# Patient Record
Sex: Male | Born: 1995 | Race: White | Hispanic: No | Marital: Single | Smoking: Current some day smoker
Health system: Southern US, Community
[De-identification: ages and names within clinical notes are randomized; demographics above are authoritative.]

## PROBLEM LIST (undated history)

## (undated) HISTORY — PX: ELBOW SURGERY: SHX618

---

## 2017-02-02 ENCOUNTER — Ambulatory Visit
Admission: RE | Admit: 2017-02-02 | Discharge: 2017-02-02 | Disposition: A | Discharge status: Home / Self Care | Payer: BC Managed Care – PPO | Source: Ambulatory Visit | Attending: Family Medicine | Admitting: Family Medicine

## 2017-02-02 ENCOUNTER — Other Ambulatory Visit: Payer: Self-pay | Admitting: Family Medicine

## 2017-02-02 DIAGNOSIS — M7989 Other specified soft tissue disorders: Secondary | ICD-10-CM | POA: Diagnosis not present

## 2017-02-02 DIAGNOSIS — S8991XA Unspecified injury of right lower leg, initial encounter: Secondary | ICD-10-CM

## 2017-02-02 DIAGNOSIS — X58XXXA Exposure to other specified factors, initial encounter: Secondary | ICD-10-CM | POA: Diagnosis not present

## 2017-02-02 DIAGNOSIS — M25561 Pain in right knee: Secondary | ICD-10-CM | POA: Diagnosis not present

## 2017-02-04 ENCOUNTER — Other Ambulatory Visit: Payer: Self-pay | Admitting: Family Medicine

## 2017-02-04 DIAGNOSIS — M25561 Pain in right knee: Secondary | ICD-10-CM

## 2017-02-05 ENCOUNTER — Ambulatory Visit
Admission: RE | Admit: 2017-02-05 | Discharge: 2017-02-05 | Disposition: A | Discharge status: Home / Self Care | Payer: BC Managed Care – PPO | Source: Ambulatory Visit | Attending: Family Medicine | Admitting: Family Medicine

## 2017-02-05 DIAGNOSIS — S8391XA Sprain of unspecified site of right knee, initial encounter: Secondary | ICD-10-CM | POA: Diagnosis not present

## 2017-02-05 DIAGNOSIS — M25561 Pain in right knee: Secondary | ICD-10-CM | POA: Diagnosis present

## 2017-02-05 DIAGNOSIS — X58XXXA Exposure to other specified factors, initial encounter: Secondary | ICD-10-CM | POA: Diagnosis not present

## 2017-02-05 DIAGNOSIS — S8002XA Contusion of left knee, initial encounter: Secondary | ICD-10-CM | POA: Insufficient documentation

## 2017-04-03 ENCOUNTER — Ambulatory Visit
Admission: RE | Admit: 2017-04-03 | Discharge: 2017-04-03 | Disposition: A | Discharge status: Home / Self Care | Payer: BC Managed Care – PPO | Source: Ambulatory Visit | Attending: Family Medicine | Admitting: Family Medicine

## 2017-04-03 ENCOUNTER — Other Ambulatory Visit: Payer: Self-pay | Admitting: Family Medicine

## 2017-04-03 DIAGNOSIS — S8992XA Unspecified injury of left lower leg, initial encounter: Secondary | ICD-10-CM

## 2017-04-03 DIAGNOSIS — X58XXXA Exposure to other specified factors, initial encounter: Secondary | ICD-10-CM | POA: Insufficient documentation

## 2017-04-03 DIAGNOSIS — S7012XA Contusion of left thigh, initial encounter: Secondary | ICD-10-CM | POA: Insufficient documentation

## 2017-04-03 DIAGNOSIS — S8392XA Sprain of unspecified site of left knee, initial encounter: Secondary | ICD-10-CM | POA: Diagnosis present

## 2017-04-03 DIAGNOSIS — M25462 Effusion, left knee: Secondary | ICD-10-CM | POA: Insufficient documentation

## 2017-04-03 DIAGNOSIS — S83512A Sprain of anterior cruciate ligament of left knee, initial encounter: Secondary | ICD-10-CM | POA: Insufficient documentation

## 2017-04-03 HISTORY — PX: KNEE ARTHROSCOPY W/ ACL RECONSTRUCTION: SHX1858

## 2017-11-25 ENCOUNTER — Ambulatory Visit (INDEPENDENT_AMBULATORY_CARE_PROVIDER_SITE_OTHER): Payer: BC Managed Care – PPO | Admitting: Family Medicine

## 2017-11-25 DIAGNOSIS — R4589 Other symptoms and signs involving emotional state: Secondary | ICD-10-CM

## 2017-11-25 NOTE — Progress Notes (Signed)
Patient presented to review returner preparticipation form. Refer to form for details of visit. Form placed in patient's chart. Discussed case with Counseling Center. Counseling Center has agreed to see patient. Patient is to call and make appointment. Informed trainer.

## 2018-01-22 ENCOUNTER — Other Ambulatory Visit: Payer: Self-pay | Admitting: Family Medicine

## 2018-01-22 ENCOUNTER — Ambulatory Visit (INDEPENDENT_AMBULATORY_CARE_PROVIDER_SITE_OTHER): Payer: BC Managed Care – PPO | Admitting: Family Medicine

## 2018-01-22 ENCOUNTER — Ambulatory Visit
Admission: RE | Admit: 2018-01-22 | Discharge: 2018-01-22 | Disposition: A | Discharge status: Home / Self Care | Payer: BC Managed Care – PPO | Source: Ambulatory Visit | Attending: Family Medicine | Admitting: Family Medicine

## 2018-01-22 ENCOUNTER — Encounter: Payer: Self-pay | Admitting: Family Medicine

## 2018-01-22 DIAGNOSIS — M25461 Effusion, right knee: Secondary | ICD-10-CM

## 2018-01-23 ENCOUNTER — Encounter (HOSPITAL_COMMUNITY): Payer: Self-pay

## 2018-01-23 ENCOUNTER — Ambulatory Visit (HOSPITAL_COMMUNITY)
Admission: RE | Admit: 2018-01-23 | Discharge: 2018-01-23 | Disposition: A | Discharge status: Home / Self Care | Payer: BC Managed Care – PPO | Source: Ambulatory Visit | Attending: Family Medicine | Admitting: Family Medicine

## 2018-01-23 DIAGNOSIS — X58XXXA Exposure to other specified factors, initial encounter: Secondary | ICD-10-CM | POA: Diagnosis not present

## 2018-01-23 DIAGNOSIS — M25461 Effusion, right knee: Secondary | ICD-10-CM | POA: Diagnosis present

## 2018-01-23 DIAGNOSIS — S83521A Sprain of posterior cruciate ligament of right knee, initial encounter: Secondary | ICD-10-CM | POA: Diagnosis not present

## 2018-02-04 NOTE — Progress Notes (Signed)
Patient presents today with history of right knee pain and swelling. He states that during practice on 8/20 his knee went into the ground forcefully. He denies feeling a "pop." He has been doing [employer] daily. He hasn't needed to take any medication for pain consistently. Patient has recently returned back to football from ACL reconstruction on his other knee.   ROS: Negative except mentioned above. Vitals as per Epic.  GENERAL: NAD, good affect MSK: R Knee - minimal effusion, FROM, -McMurray, -Lachman, -Anterior Drawer, +Posterior Drawer, no significant varus or valgus instability, nv intact NEURO: CN II-XII grossly intact   A/P: R Knee Injury - suspect PCL injury, discussed with patient that typically not surgically repaired, will get imaging and have patient see Dr. Ardine Eng in the coming days, will discuss brace with trainer, NSAIDs prn pain, continue GameReady.   *Patient started to see counseling services on campus before this injury due to his anxiety related to coming back to football from his ACL. I have encouraged him to continue seeing the counselor especially given this recent injury/setback. Patient addresses understanding and says he will do so. He obviously admits to his disappointment in getting injured again but denies any suicidal or homicidal ideations at this time. Encouraged patient to seek help if needed. Reviewed what to do in case of a crisis. Informed patient that if he wants he can allow counselor to speak to me as well regarding their sessions. Patient is always welcome to come see me to talk. Patient appreciative.  Reviewed everything above with trainer.

## 2018-02-13 ENCOUNTER — Ambulatory Visit (INDEPENDENT_AMBULATORY_CARE_PROVIDER_SITE_OTHER): Payer: BC Managed Care – PPO | Admitting: Family Medicine

## 2018-02-13 DIAGNOSIS — F3289 Other specified depressive episodes: Secondary | ICD-10-CM

## 2018-02-26 ENCOUNTER — Ambulatory Visit (INDEPENDENT_AMBULATORY_CARE_PROVIDER_SITE_OTHER): Payer: BC Managed Care – PPO | Admitting: Family Medicine

## 2018-02-26 DIAGNOSIS — F329 Major depressive disorder, single episode, unspecified: Secondary | ICD-10-CM

## 2018-02-26 DIAGNOSIS — R4589 Other symptoms and signs involving emotional state: Secondary | ICD-10-CM

## 2018-02-26 NOTE — Progress Notes (Signed)
She was asked to come in to discuss recent appointment with Dr. Maryruth Bun. Patient states that the appointment went well.  He has started his 50 mg of Zoloft.  He denies any side effects at this time. He denies any suicidal or homicidal ideations. He admits that he went home to speak to his parents about a possible Medical DQ from football.  This is given his multiple knee injuries (MCL, ACL, PCL) over the last few years and also his current depression. He states that his parents were supportive of whatever decision makes him happy.  Patient denies any problems with sleep or appetite.  ROS: Negative except mentioned above. Vitals as per Epic GENERAL: NAD, normal affect, good eye contact  NEURO: CN II-XII grossly intact   A/P: Depression - has started Zoloft, has follow-up appointment with Dr. Maryruth Bun in 1 week, would continue to see counseling services as he has been doing [place] [employer]), he knows the crisis line if he needs it, denies any suicidal ideations at this time, will follow up with me as needed, will discuss Medical DQ with Dr. Ardine Eng and athletic training staff.

## 2018-03-13 ENCOUNTER — Ambulatory Visit (INDEPENDENT_AMBULATORY_CARE_PROVIDER_SITE_OTHER): Payer: BC Managed Care – PPO | Admitting: Family Medicine

## 2018-03-13 DIAGNOSIS — F329 Major depressive disorder, single episode, unspecified: Secondary | ICD-10-CM

## 2018-03-13 DIAGNOSIS — R4589 Other symptoms and signs involving emotional state: Secondary | ICD-10-CM

## 2018-03-13 NOTE — Progress Notes (Signed)
Asked patient to come in to discuss recent conversation with Athletic Administrators regarding possible Medical DQ.  Told patient that Athletics has been presented with the information and that they are considering different options for [place].  I wanted to make sure that Lance Brown knows that he has my support and Dr. Shelda Altes support.  I have recommended that he continue to meet with counseling services and Dr.Kapur as scheduled.  He knows about the crisis line if needed.  He admits that he is taking his antidepressant.  He denies any suicidal or homicidal ideations at this time.  He plans on going to [place] with his girlfriend next week fall break.  I am hoping that a decision will be made by that time.  Patient appreciative.  Encouraged patient to reach out to me if he has any concerns or questions.

## 2018-04-10 ENCOUNTER — Ambulatory Visit (INDEPENDENT_AMBULATORY_CARE_PROVIDER_SITE_OTHER): Payer: BC Managed Care – PPO | Admitting: Family Medicine

## 2018-04-10 ENCOUNTER — Encounter: Payer: Self-pay | Admitting: Family Medicine

## 2018-04-10 VITALS — Resp 14

## 2018-04-10 DIAGNOSIS — F329 Major depressive disorder, single episode, unspecified: Secondary | ICD-10-CM

## 2018-04-10 DIAGNOSIS — F32A Depression, unspecified: Secondary | ICD-10-CM

## 2018-04-10 NOTE — Progress Notes (Signed)
Patient presents today for follow-up regarding his depression.  Patient states that he has been doing well since leaving football.  He has been concentrating mostly on his schoolwork.  He has been doing some athletic activity in the gym as well.  He continues to meet with the counselor and the counseling center on a regular basis and also has been meeting with Dr. Maryruth Bun his psychiatrist.  He admits that he is continuing to take his antidepressant as prescribed.  He plans to get involved in some group activities on campus next semester.  He has plans to go home with his girlfriend for Thanksgiving.  He admits that he has continued to socialize with his friends on the football team.  They have not reacted negatively to his decision on leaving football.  He has been happy about this. He denies any suicidal or homicidal thoughts.  ROS: Negative except mentioned above. Vitals as per Epic. GENERAL: NAD, normal affect, good eye contact NEURO: CN II-XII grossly intact   A/P: Depression -appears to be stable at this time, continue to meet with Dr. Maryruth Bun and counselor as suggested by them, follow-up with me in December or early January, sooner if needed.  S/P PCL injury - denies any issues at this time, needs to continue rehab, will send out email to Adria Dill, Dr. Ardine Eng and Excell Seltzer to coordinate a time for them to meet at the [facility] before or after a basketball game, would recommend that he continue his rehab at the [facility] under the supervision of Minerva Areola and strength and conditioning.

## 2018-04-21 NOTE — Progress Notes (Signed)
Patient presents today to discuss his depressed mood.  He has been seeing a counselor at the counseling center.  Patient admits that he has lost interest in football.  He states that initially he had anxiety related to returning to football after his MCL and ACL injuries.  After his recent setback with his PCL injury he has continued to disconnect with football.  He states that football does not bring him any enjoyment anymore.  He would mostly play just for his family.  He has a small group of friends on the team.  He feels that the coaching staff does not pay attention to him since he is injured.  He admits that he has had thoughts of suicide recently but does not have a plan.  He denies being suicidal today.  He denies any homicidal ideations.  He admits to normal sleep and appetite.  He does have a girlfriend who he confides in.  He believes that if he were distanced from football his mood may improve.  He believes football is a significant reason for his anxiety and depression he has.  He does however understand that he is here on scholarship.  He admits that his parents would not be able to afford his education at [employer] if he did not have his scholarship.  ROS: Negative except mentioned above. Vitals as per Epic.  GENERAL: NAD NEURO: CN II-XII grossly intact, flat affect  A/P: Depression, Anxiety -I have recommended that patient see Dr. Maryruth BunKapur.  Patient is agreeable to this.  He is also agreeable to start an antidepressant if recommended.  Will wait to speak to Dr. Maryruth BunKapur after Lance Brown's appointment to discuss if she feels removing him out from football would benefit his mental health. I do also recommend that he continue to see the counselor on campus.  Patient states that plans to do this.  Patient also has the number for the crisis line in case he needs it.

## 2018-12-03 ENCOUNTER — Other Ambulatory Visit: Payer: Self-pay | Admitting: *Deleted

## 2018-12-03 DIAGNOSIS — Z20822 Contact with and (suspected) exposure to covid-19: Secondary | ICD-10-CM

## 2018-12-07 ENCOUNTER — Other Ambulatory Visit: Payer: Self-pay

## 2018-12-07 DIAGNOSIS — Z20822 Contact with and (suspected) exposure to covid-19: Secondary | ICD-10-CM

## 2018-12-12 LAB — NOVEL CORONAVIRUS, NAA: SARS-CoV-2, NAA: NOT DETECTED

## 2019-01-11 ENCOUNTER — Other Ambulatory Visit: Payer: Self-pay

## 2019-01-11 DIAGNOSIS — Z20822 Contact with and (suspected) exposure to covid-19: Secondary | ICD-10-CM

## 2019-01-12 LAB — NOVEL CORONAVIRUS, NAA: SARS-CoV-2, NAA: NOT DETECTED

## 2019-02-16 ENCOUNTER — Telehealth (INDEPENDENT_AMBULATORY_CARE_PROVIDER_SITE_OTHER): Payer: BC Managed Care – PPO | Admitting: Family Medicine

## 2019-02-16 ENCOUNTER — Other Ambulatory Visit: Payer: Self-pay | Admitting: *Deleted

## 2019-02-16 DIAGNOSIS — R05 Cough: Secondary | ICD-10-CM

## 2019-02-16 DIAGNOSIS — R059 Cough, unspecified: Secondary | ICD-10-CM

## 2019-02-16 DIAGNOSIS — J029 Acute pharyngitis, unspecified: Secondary | ICD-10-CM | POA: Diagnosis not present

## 2019-02-16 DIAGNOSIS — Z20822 Contact with and (suspected) exposure to covid-19: Secondary | ICD-10-CM

## 2019-02-17 ENCOUNTER — Other Ambulatory Visit: Payer: Self-pay

## 2019-02-17 NOTE — Progress Notes (Signed)
Virtual Visit Agreed To By Patient Patient admits to having sore throat, chills, little cough, fatigue since yesterday. Denies fever, CP, SOB, N/V/D, abdominal pain, severe headache. Has not taken any medications. Roommates have similar symptoms as well.   ROS: Negative except mentioned above. GENERAL: NAD *No Exam  A/P: Viral Symptoms -will test for COVID-19, Tylenol/Ibuprofen prn, rest, hydration, if worsening symptoms go to the ER as discussed, quarantine in room, student concerns will be emailed and will contact patient, no athletic activity, patient appreciative, no further questions.

## 2019-02-18 LAB — NOVEL CORONAVIRUS, NAA: SARS-CoV-2, NAA: DETECTED — AB

## 2019-03-01 ENCOUNTER — Other Ambulatory Visit: Payer: Self-pay | Admitting: Family Medicine

## 2019-03-01 ENCOUNTER — Ambulatory Visit: Payer: BC Managed Care – PPO | Admitting: Family Medicine

## 2019-03-01 DIAGNOSIS — U071 COVID-19: Secondary | ICD-10-CM

## 2019-03-01 DIAGNOSIS — I400 Infective myocarditis: Secondary | ICD-10-CM

## 2019-03-01 DIAGNOSIS — Z8619 Personal history of other infectious and parasitic diseases: Secondary | ICD-10-CM

## 2019-03-01 DIAGNOSIS — Z8616 Personal history of COVID-19: Secondary | ICD-10-CM

## 2019-03-04 ENCOUNTER — Other Ambulatory Visit: Payer: Self-pay

## 2019-03-04 ENCOUNTER — Other Ambulatory Visit: Payer: BC Managed Care – PPO | Admitting: Family Medicine

## 2019-03-04 DIAGNOSIS — Z0000 Encounter for general adult medical examination without abnormal findings: Secondary | ICD-10-CM

## 2019-03-05 LAB — TROPONIN I: Troponin I: <0.01 ng/mL (ref 0.00–0.04)

## 2019-03-09 ENCOUNTER — Ambulatory Visit (INDEPENDENT_AMBULATORY_CARE_PROVIDER_SITE_OTHER): Payer: BC Managed Care – PPO

## 2019-03-09 ENCOUNTER — Other Ambulatory Visit: Payer: Self-pay

## 2019-03-09 DIAGNOSIS — U071 COVID-19: Secondary | ICD-10-CM | POA: Diagnosis not present

## 2019-03-09 DIAGNOSIS — Z8619 Personal history of other infectious and parasitic diseases: Secondary | ICD-10-CM | POA: Diagnosis not present

## 2019-03-09 DIAGNOSIS — I400 Infective myocarditis: Secondary | ICD-10-CM

## 2019-03-09 DIAGNOSIS — Z8616 Personal history of COVID-19: Secondary | ICD-10-CM

## 2019-08-09 ENCOUNTER — Other Ambulatory Visit: Payer: Self-pay | Admitting: Family Medicine

## 2019-08-09 ENCOUNTER — Other Ambulatory Visit: Payer: Self-pay

## 2019-08-09 ENCOUNTER — Ambulatory Visit
Admission: RE | Admit: 2019-08-09 | Discharge: 2019-08-09 | Disposition: A | Discharge status: Home / Self Care | Payer: BC Managed Care – PPO | Source: Ambulatory Visit | Attending: Family Medicine | Admitting: Family Medicine

## 2019-08-09 DIAGNOSIS — S99911A Unspecified injury of right ankle, initial encounter: Secondary | ICD-10-CM

## 2019-08-09 DIAGNOSIS — S8992XA Unspecified injury of left lower leg, initial encounter: Secondary | ICD-10-CM | POA: Diagnosis not present

## 2019-10-31 IMAGING — MR MR KNEE*R* W/O CM
4 of 6 series · 19 of 40 positions shown · non-contrast
Comparison: None.

CLINICAL DATA: Patient fell on flexed knee playing football.
Swelling with posterior and lateral pain with clicking.

EXAM:
MRI OF THE RIGHT KNEE WITHOUT CONTRAST
TECHNIQUE: Multiplanar, multisequence MR imaging of the knee was performed. No
intravenous contrast was administered.

[Series 2: T2 fat-sat · axial · 4.0mm · 0.39mm/px · z∈[-28,+92]mm · 3 of 30 slices shown (1 of 2)]
[im 6/30]
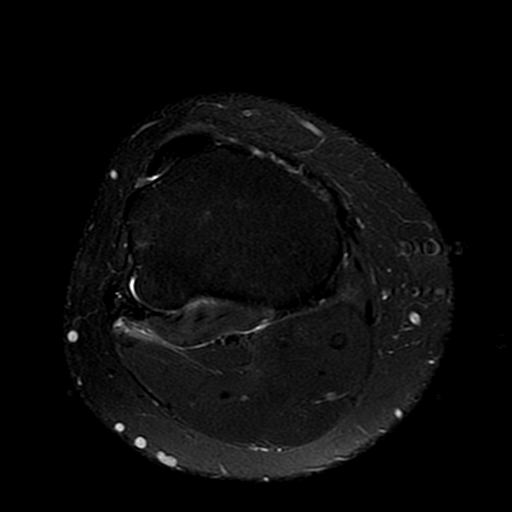
[im 18/30]
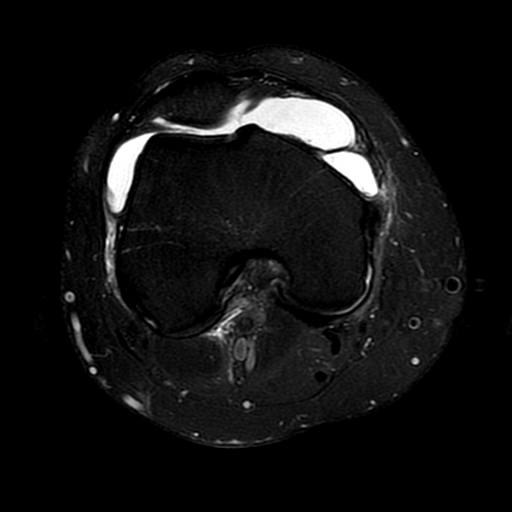
[im 30/30]
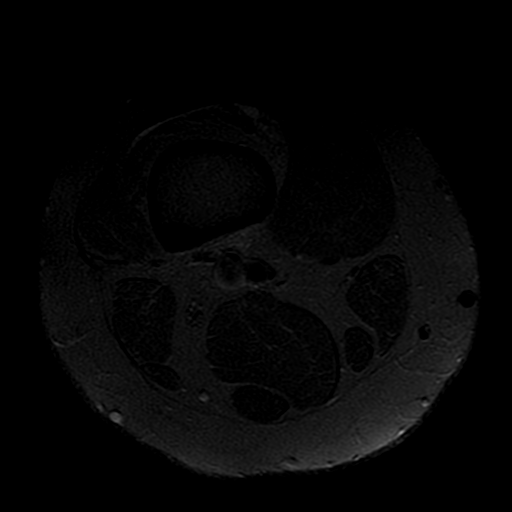

[Series 4: PD fat-sat · coronal · 4.0mm · 0.39mm/px · 6 of 24 slices shown (1 of 2)]
[im 1/24]
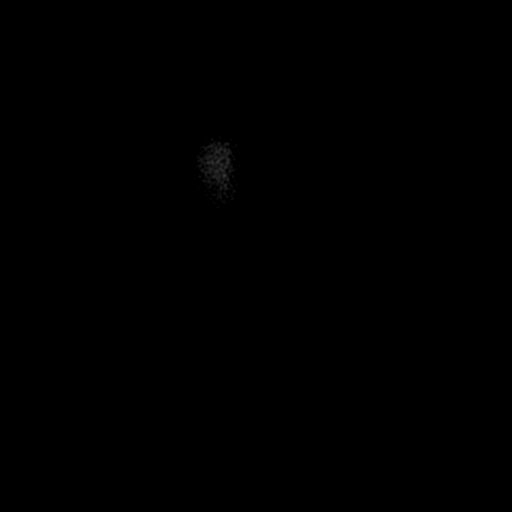
[im 5/24]
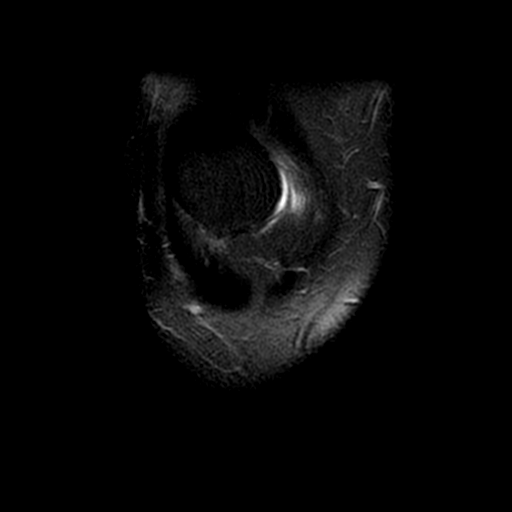
[im 10/24]
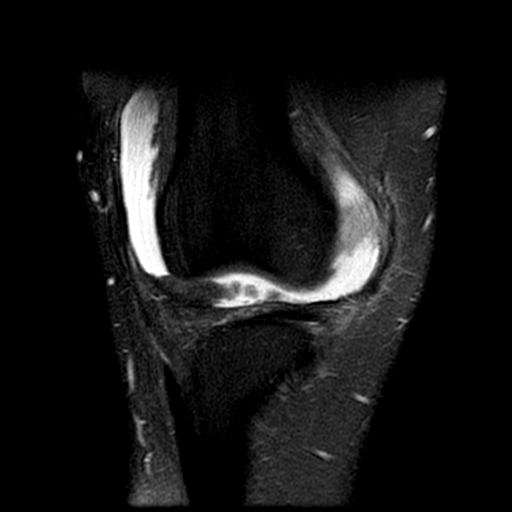
[im 14/24]
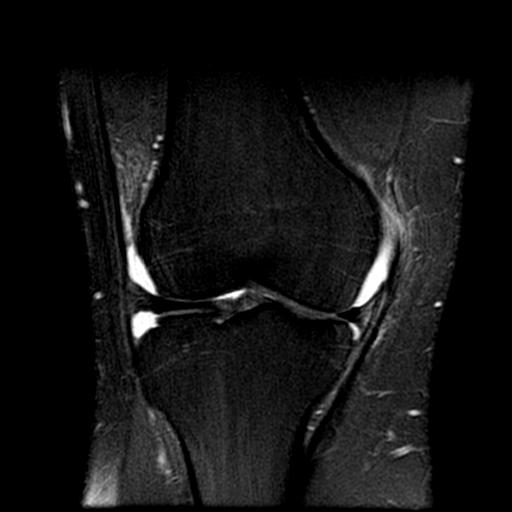
[im 19/24]
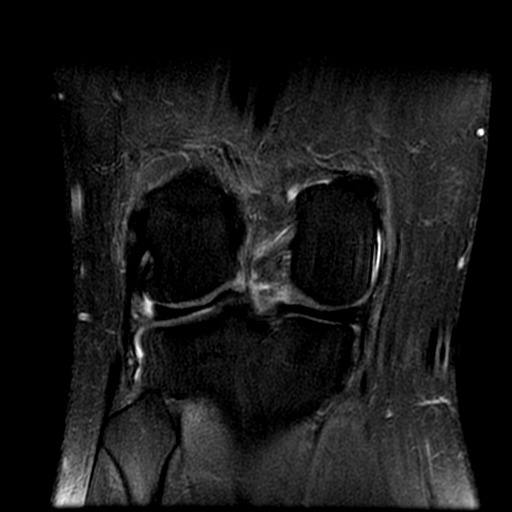
[im 24/24]
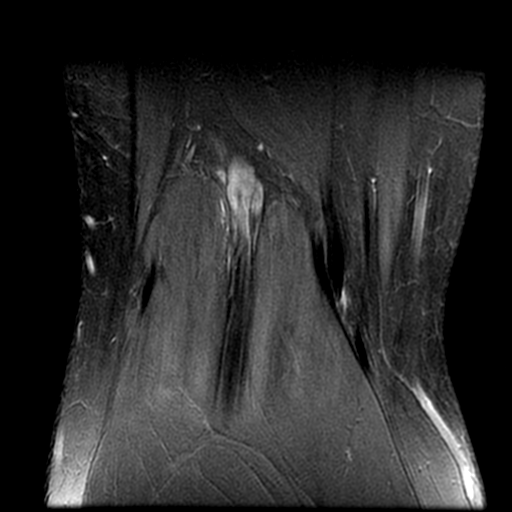

[Series 5: T2 fat-sat · coronal · 4.0mm · 0.39mm/px · 3 of 24 slices shown (2 of 2)]
[im 5/24]
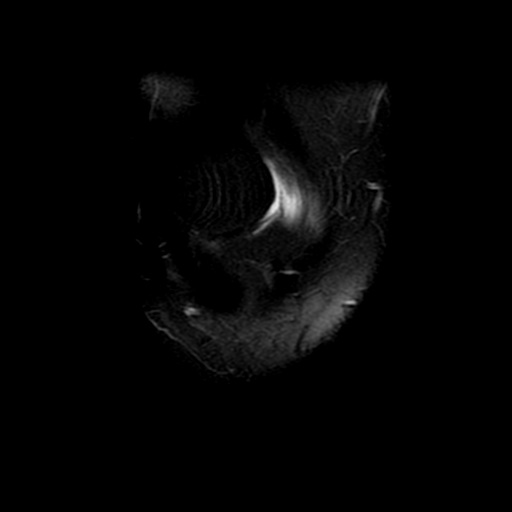
[im 14/24]
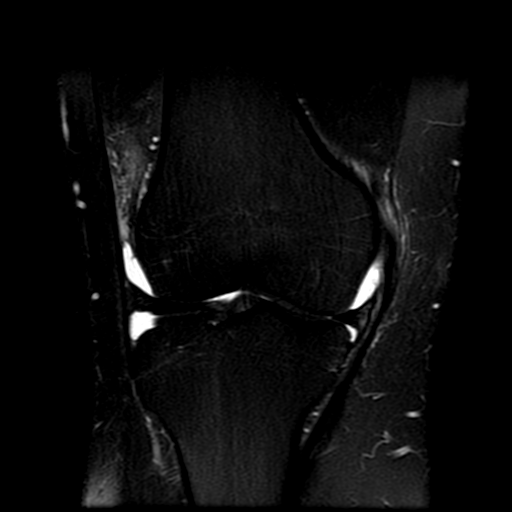
[im 24/24]
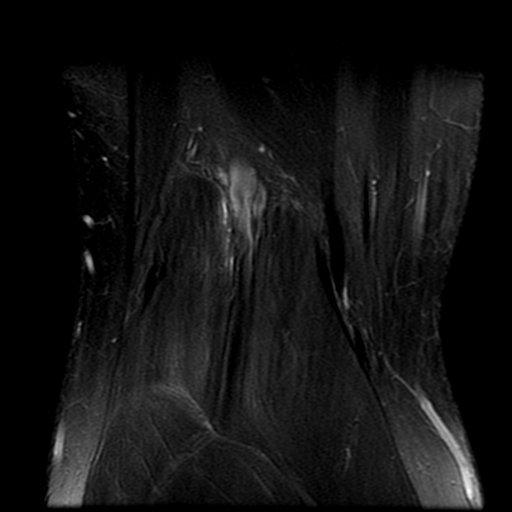

[Series 6: PD fat-sat · sagittal · 3.0mm · 0.39mm/px · 7 of 32 slices shown (2 of 2)]
[im 1/32]
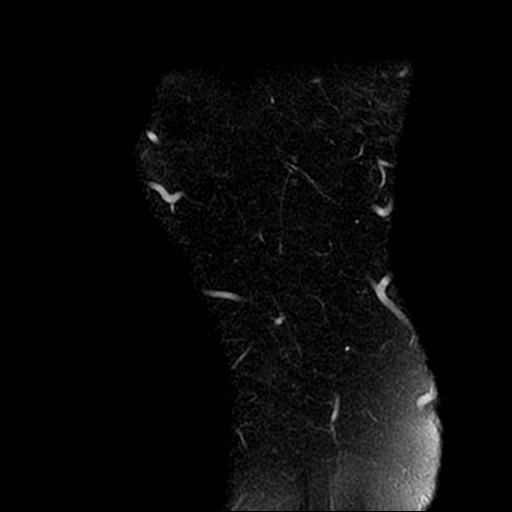
[im 5/32]
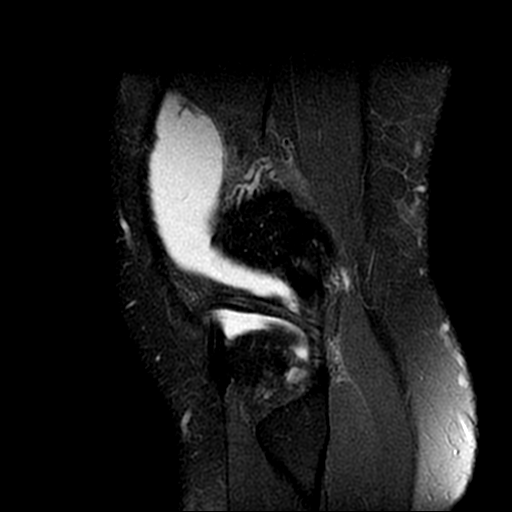
[im 9/32]
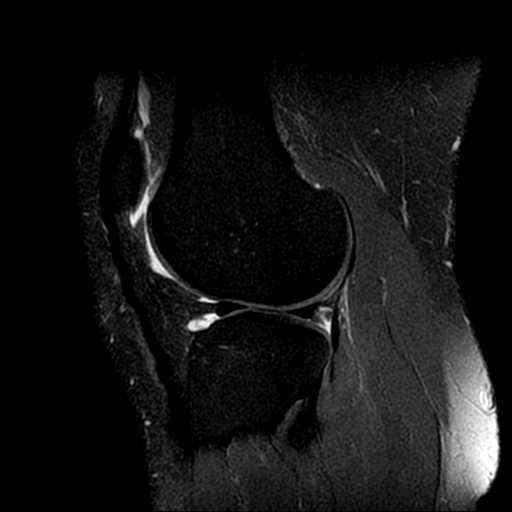
[im 14/32]
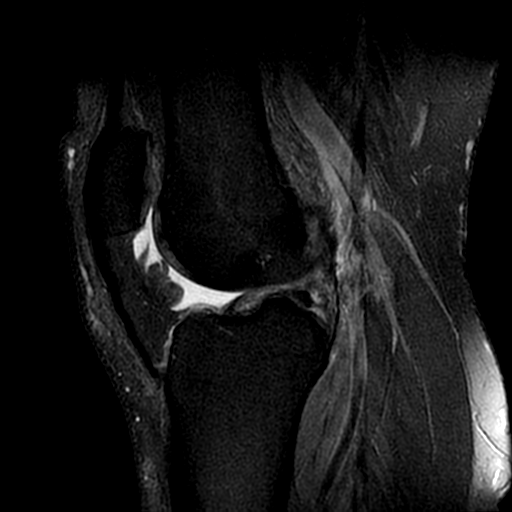
[im 18/32]
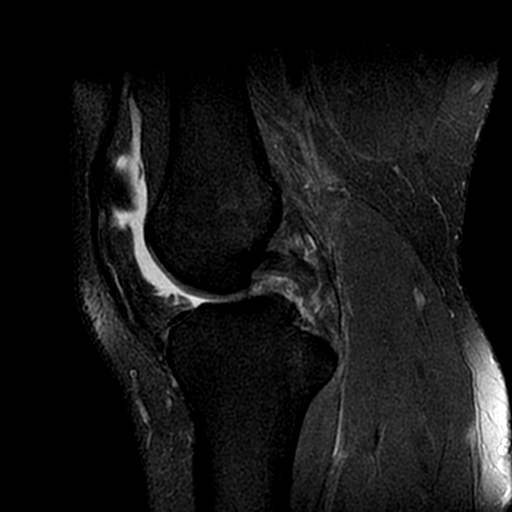
[im 23/32]
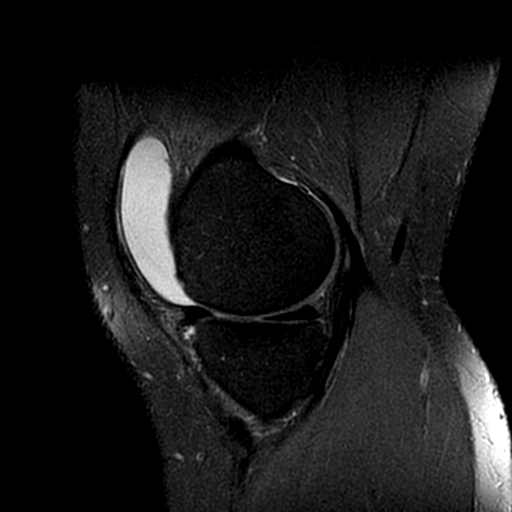
[im 27/32]
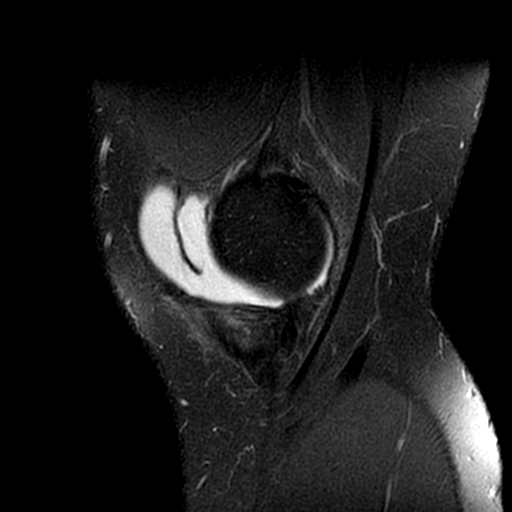

[19 of 40 positions shown; findings below may reference images not displayed]

FINDINGS: MENISCI

Medial meniscus: Intact and similar in appearance to prior.

Lateral meniscus:  Intact and of normal morphology.

LIGAMENTS

Cruciates: Thickened partially torn appearance of the PCL with
interstitial split tear noted simulating a double PCL sign of a
bucket-handle tear. Given the thickened appearance and discontinuous
portions of the anterior fibers, suspect that this is more related
to a tear. Stable appearance of the anterior cruciate ligament
without tear.

Collaterals:  Intact

CARTILAGE

Patellofemoral:  Intact

Medial: Mild partial-thickness cartilage loss of the medial
femorotibial compartment.

Lateral:  No chondral defect.

Joint: Moderate suprapatellar joint effusion with medial plica.
Intact Hoffa's fat pad.

Popliteal Fossa: Mild strain of the popliteus muscle. No popliteal
cyst.

Extensor Mechanism:  Intact quadriceps tendon and patellar tendon.

Bones: No focal marrow signal abnormality. No fracture or
dislocation.

Other: None.
IMPRESSION: 1. Partial tear of the posterior cruciate ligament with longitudinal
split tear component along the distal half.
2. No meniscal tear identified.
3. Moderate suprapatellar joint effusion with medial plica.
4. Mild strain of the popliteus.

## 2021-05-16 IMAGING — CR DG KNEE COMPLETE 4+V*L*
1 series · 5 of 5 positions shown · non-contrast
Comparison: None.

CLINICAL DATA: Recent hyperextension injury with knee pain, initial
encounter

EXAM:
LEFT KNEE - COMPLETE 4+ VIEW

[Series 1: dg knee complete 4 views left · 0.14mm/px · 5 of 5 slices shown]
[im 1/5]
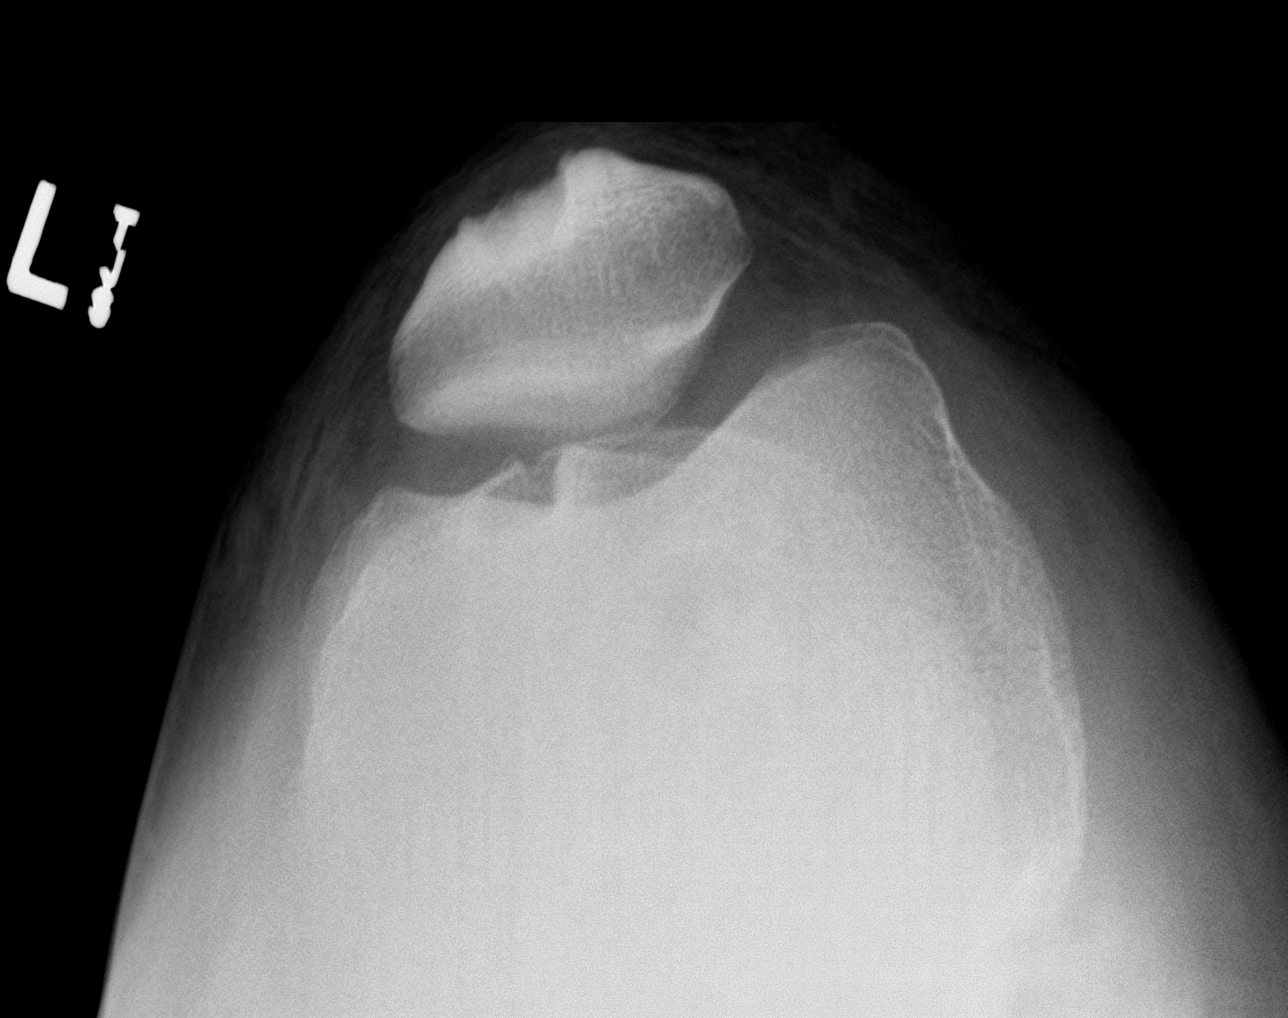
[im 2/5]
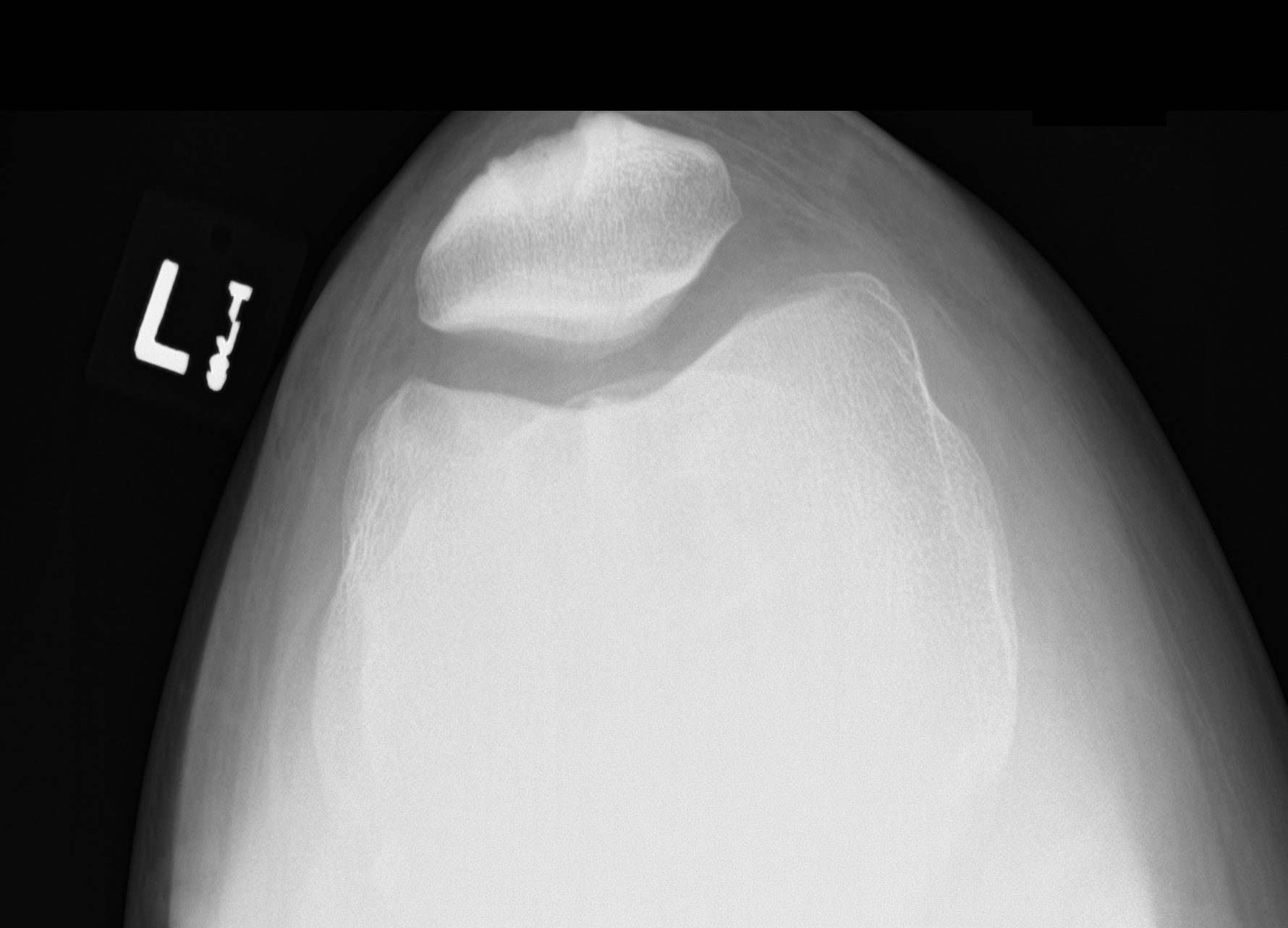
[im 3/5]
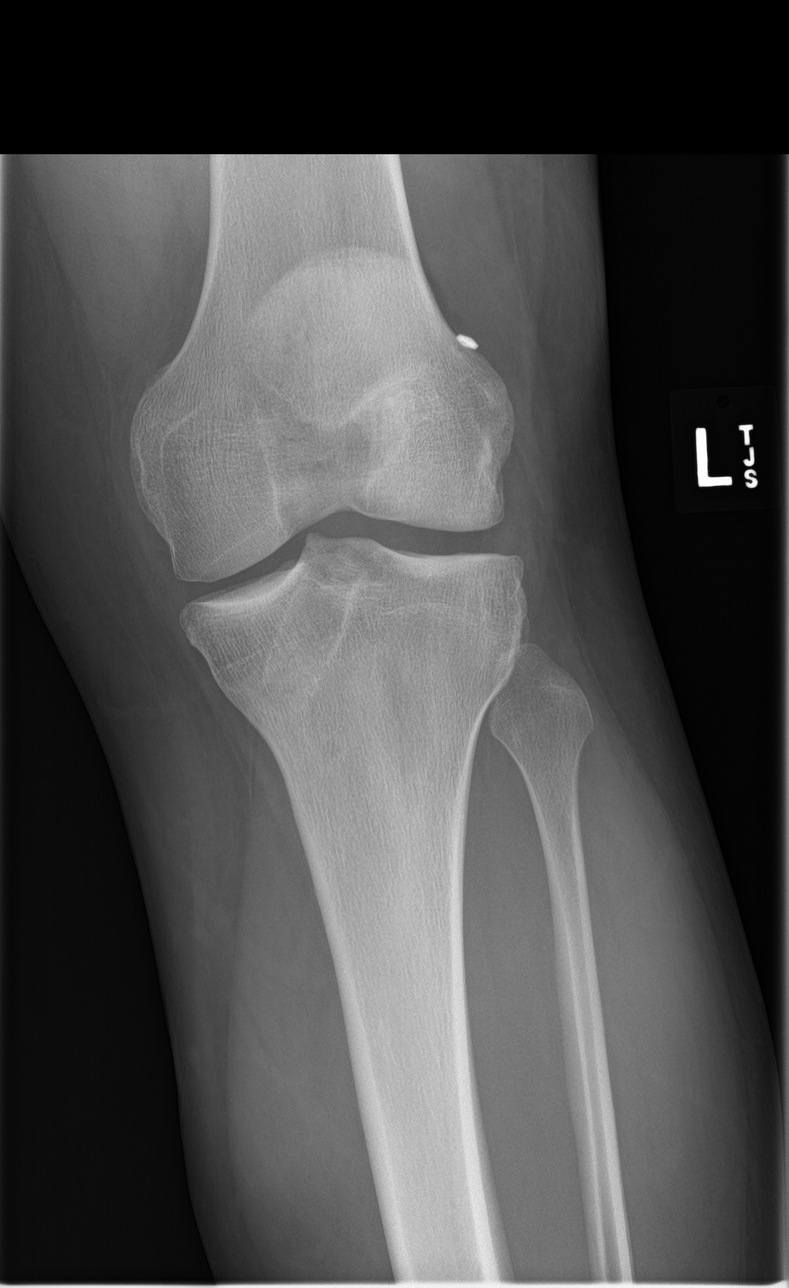
[im 4/5]
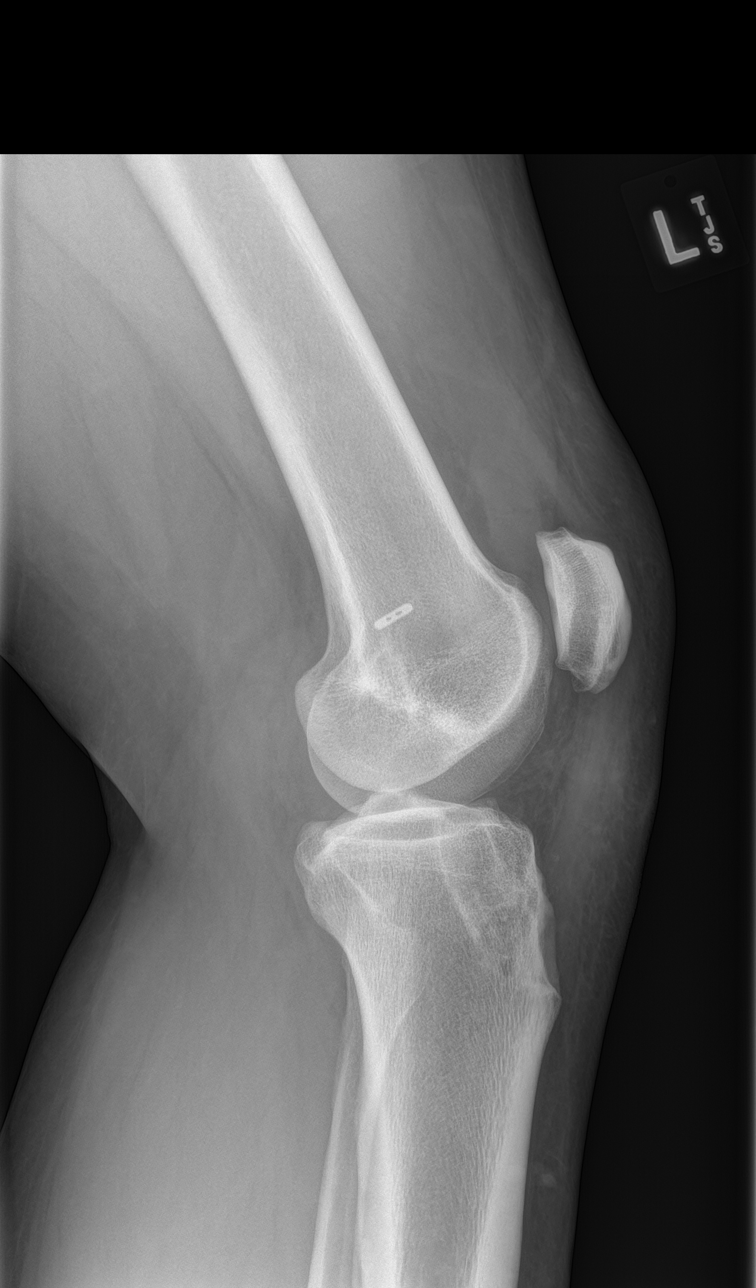
[im 5/5]
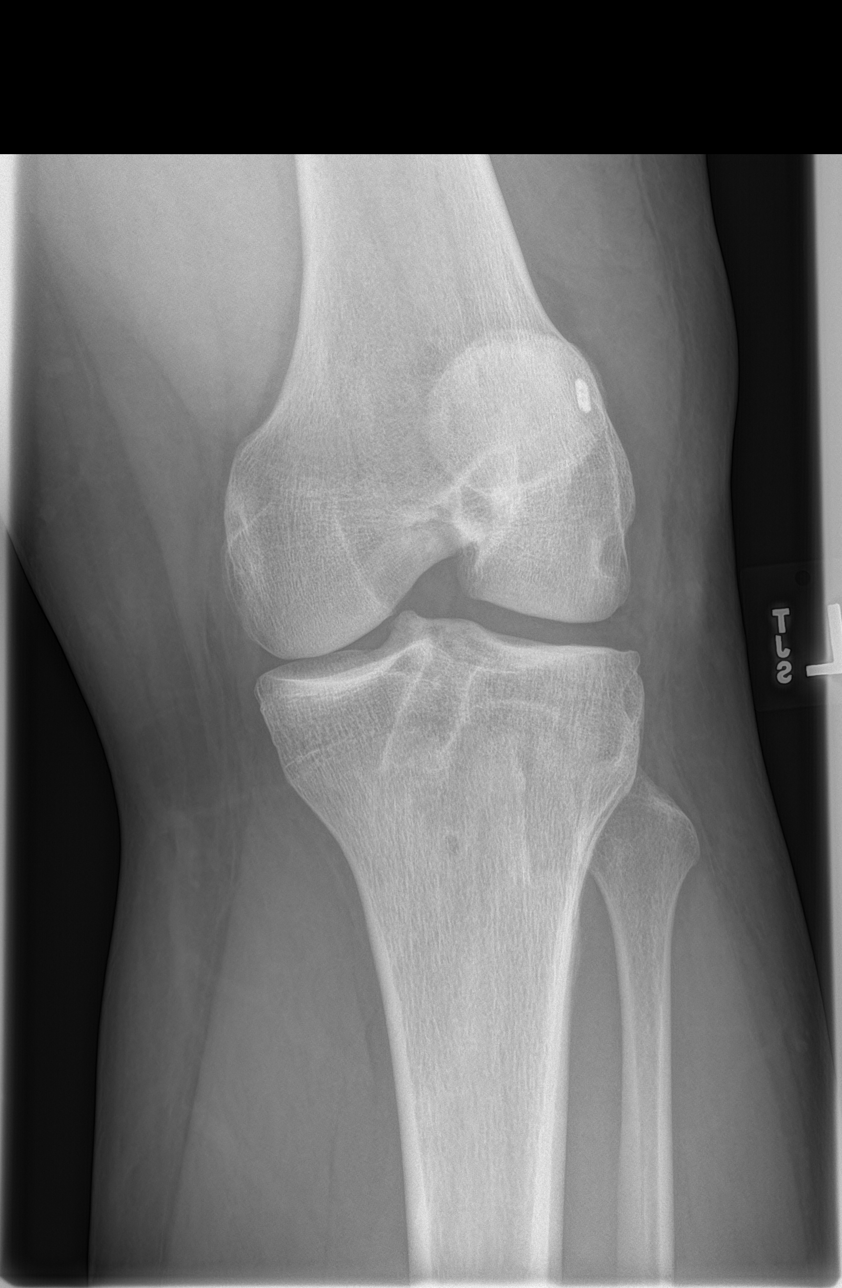

[5 of 5 positions shown; findings below may reference images not displayed]

FINDINGS: Changes consistent with prior ACL repair are noted. Small joint
effusion is seen. Mild patellofemoral degenerative changes are seen.
Mild medial joint space narrowing is noted as well. No acute
fracture is seen.
IMPRESSION: Small joint effusion.

Changes consistent with ACL repair.
